# Patient Record
Sex: Female | Born: 1965 | Race: Black or African American | Hispanic: No | Marital: Single | Smoking: Never smoker
Health system: Southern US, Community
[De-identification: ages and names within clinical notes are randomized; demographics above are authoritative.]

## PROBLEM LIST (undated history)

## (undated) HISTORY — PX: DILATION AND CURETTAGE OF UTERUS: SHX78

---

## 2009-12-26 ENCOUNTER — Ambulatory Visit: Payer: Self-pay | Admitting: Orthopedic Surgery

## 2013-09-14 DIAGNOSIS — M545 Low back pain, unspecified: Secondary | ICD-10-CM | POA: Insufficient documentation

## 2015-05-21 ENCOUNTER — Ambulatory Visit (INDEPENDENT_AMBULATORY_CARE_PROVIDER_SITE_OTHER): Payer: Federal, State, Local not specified - PPO | Admitting: Podiatry

## 2015-05-21 ENCOUNTER — Ambulatory Visit (INDEPENDENT_AMBULATORY_CARE_PROVIDER_SITE_OTHER): Payer: Federal, State, Local not specified - PPO

## 2015-05-21 ENCOUNTER — Encounter: Payer: Self-pay | Admitting: Podiatry

## 2015-05-21 VITALS — BP 113/67 | HR 84 | Resp 16

## 2015-05-21 DIAGNOSIS — M5430 Sciatica, unspecified side: Secondary | ICD-10-CM | POA: Diagnosis not present

## 2015-05-21 DIAGNOSIS — M779 Enthesopathy, unspecified: Secondary | ICD-10-CM | POA: Diagnosis not present

## 2015-05-21 DIAGNOSIS — M2010 Hallux valgus (acquired), unspecified foot: Secondary | ICD-10-CM

## 2015-05-21 DIAGNOSIS — Q6650 Congenital pes planus, unspecified foot: Secondary | ICD-10-CM | POA: Diagnosis not present

## 2015-05-21 NOTE — Progress Notes (Signed)
   Subjective:    Patient ID: Barbara Dickerson, female    DOB: 19-Jul-1965, 50 y.o.   MRN: [id number]  HPI: She presents today with a 2 to three-month duration of numbness and tingling to the dorsal aspect of her right foot greater than her left with burning and tingling to the dorsal aspect of the hallux bilaterally. She does relate a history of back problems which seems to have gotten worse over the past few months she also relates a history of sciatica. She's tried nothing to alleviate her symptoms at this point. She states that she no she has bunion deformities and would like to consider having them corrected at some point.    Review of Systems  HENT: Positive for sneezing.   Eyes: Positive for itching.  Musculoskeletal: Positive for myalgias, back pain and arthralgias.  All other systems reviewed and are negative.      Objective:   Physical Exam: 50 year old female vital signs stable alert and oriented 3 pulses are palpable. Neurologic sensorium is intact per Semmes-Weinstein monofilament. Deep tendon reflexes are intact muscle strength is normal bilateral. Orthopedic evaluation demonstrates pes planus bilateral with hallux abductovalgus deformities bilateral right greater than left. No reproducible neurological findings. Radiographs confirm pes planus with hallux valgus deformities. Cutaneous evaluation of a straight supple well-hydrated cutis no erythema edema cellulitis drainage or odor.        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  Plan: Suggested that she follow-up with neurosurgery for possible injections or treatment of her spine and I also suggested orthotics to help align her lower extremity. She will follow up with [place] once she knows whether or not her insurance will take.

## 2020-07-03 ENCOUNTER — Other Ambulatory Visit: Payer: Self-pay | Admitting: Family Medicine

## 2020-07-03 DIAGNOSIS — Z1231 Encounter for screening mammogram for malignant neoplasm of breast: Secondary | ICD-10-CM

## 2020-07-15 ENCOUNTER — Ambulatory Visit
Admission: RE | Admit: 2020-07-15 | Discharge: 2020-07-15 | Disposition: A | Discharge status: Home / Self Care | Payer: Federal, State, Local not specified - PPO | Source: Ambulatory Visit | Attending: Family Medicine | Admitting: Family Medicine

## 2020-07-15 ENCOUNTER — Other Ambulatory Visit: Payer: Self-pay

## 2020-07-15 DIAGNOSIS — Z1231 Encounter for screening mammogram for malignant neoplasm of breast: Secondary | ICD-10-CM | POA: Insufficient documentation

## 2020-07-16 ENCOUNTER — Telehealth (INDEPENDENT_AMBULATORY_CARE_PROVIDER_SITE_OTHER): Payer: Self-pay | Admitting: Gastroenterology

## 2020-07-16 ENCOUNTER — Other Ambulatory Visit: Payer: Self-pay

## 2020-07-16 DIAGNOSIS — Z1211 Encounter for screening for malignant neoplasm of colon: Secondary | ICD-10-CM

## 2020-07-16 MED ORDER — GOLYTELY 236 G PO SOLR: 4000.0000 mL | Freq: Once | ORAL | 0 refills | Status: AC

## 2020-07-16 NOTE — Progress Notes (Signed)
Gastroenterology Pre-Procedure Review  Request Date: 08/520 Requesting Physician: Dr. Bonna Gains  PATIENT REVIEW QUESTIONS: The patient responded to the following health history questions as indicated:    1. Are you having any GI issues? yes (occasional abdominal discomfort, and sometimes rectal discomfort is experienced prior to having a bowel movement) 2. Do you have a personal history of Polyps? no 3. Do you have a family history of Colon Cancer or Polyps? no 4. Diabetes Mellitus? no 5. Joint replacements in the past 12 months?no 6. Major health problems in the past 3 months?no 7. Any artificial heart valves, MVP, or defibrillator?no    MEDICATIONS & ALLERGIES:    Patient reports the following regarding taking any anticoagulation/antiplatelet therapy:   Plavix, Coumadin, Eliquis, Xarelto, Lovenox, Pradaxa, Brilinta, or Effient? no Aspirin? no  Patient confirms/reports the following medications:  Current Outpatient Medications  Medication Sig Dispense Refill  . etodolac (LODINE) 500 MG tablet Take by mouth.    . methocarbamol (ROBAXIN) 750 MG tablet Take by mouth.    . predniSONE (DELTASONE) 10 MG tablet     . traMADol (ULTRAM) 50 MG tablet Take by mouth.     No current facility-administered medications for this visit.    Patient confirms/reports the following allergies:  No Known Allergies  No orders of the defined types were placed in this encounter.   AUTHORIZATION INFORMATION Primary Insurance: 1D#: Group #:  Secondary Insurance: 1D#: Group #:  SCHEDULE INFORMATION: Date: Thursday 08/07/20 Time: Location:[facility]

## 2020-08-06 ENCOUNTER — Telehealth: Payer: Self-pay | Admitting: Gastroenterology

## 2020-08-06 NOTE — Telephone Encounter (Signed)
Patient has questions regarding her prep.  Her procedure is tomorrow,  please call asap

## 2020-08-07 ENCOUNTER — Encounter: Payer: Self-pay | Admitting: Gastroenterology

## 2020-08-07 ENCOUNTER — Encounter
Admission: RE | Disposition: A | Discharge status: Home / Self Care | Payer: Self-pay | Source: Home / Self Care | Attending: Gastroenterology

## 2020-08-07 ENCOUNTER — Ambulatory Visit: Payer: Federal, State, Local not specified - PPO | Admitting: Anesthesiology

## 2020-08-07 ENCOUNTER — Ambulatory Visit
Admission: RE | Admit: 2020-08-07 | Discharge: 2020-08-07 | Disposition: A | Discharge status: Home / Self Care | Payer: Federal, State, Local not specified - PPO | Attending: Gastroenterology | Admitting: Gastroenterology

## 2020-08-07 DIAGNOSIS — Z7952 Long term (current) use of systemic steroids: Secondary | ICD-10-CM | POA: Insufficient documentation

## 2020-08-07 DIAGNOSIS — D122 Benign neoplasm of ascending colon: Secondary | ICD-10-CM | POA: Insufficient documentation

## 2020-08-07 DIAGNOSIS — D120 Benign neoplasm of cecum: Secondary | ICD-10-CM | POA: Insufficient documentation

## 2020-08-07 DIAGNOSIS — K635 Polyp of colon: Secondary | ICD-10-CM

## 2020-08-07 DIAGNOSIS — Z1211 Encounter for screening for malignant neoplasm of colon: Secondary | ICD-10-CM | POA: Diagnosis present

## 2020-08-07 DIAGNOSIS — K5730 Diverticulosis of large intestine without perforation or abscess without bleeding: Secondary | ICD-10-CM | POA: Insufficient documentation

## 2020-08-07 HISTORY — PX: COLONOSCOPY WITH PROPOFOL: SHX5780

## 2020-08-07 LAB — POCT PREGNANCY, URINE: Preg Test, Ur: NEGATIVE

## 2020-08-07 SURGERY — COLONOSCOPY WITH PROPOFOL
Anesthesia: General

## 2020-08-07 MED ORDER — LIDOCAINE HCL (CARDIAC) PF 100 MG/5ML IV SOSY
PREFILLED_SYRINGE | INTRAVENOUS | Status: DC | PRN
  Administered 2020-08-07: 50 mg via INTRAVENOUS

## 2020-08-07 MED ORDER — PROPOFOL 500 MG/50ML IV EMUL
INTRAVENOUS | Status: DC | PRN
  Administered 2020-08-07: 175 ug/kg/min via INTRAVENOUS

## 2020-08-07 MED ORDER — PROPOFOL 10 MG/ML IV BOLUS
INTRAVENOUS | Status: DC | PRN
  Administered 2020-08-07: 70 mg via INTRAVENOUS

## 2020-08-07 MED ORDER — LIDOCAINE HCL (PF) 2 % IJ SOLN
INTRAMUSCULAR | Status: AC
  Filled 2020-08-07: qty 5

## 2020-08-07 MED ORDER — PROPOFOL 500 MG/50ML IV EMUL
INTRAVENOUS | Status: AC
  Filled 2020-08-07: qty 50

## 2020-08-07 MED ORDER — SODIUM CHLORIDE 0.9 % IV SOLN: INTRAVENOUS | Status: DC

## 2020-08-07 NOTE — Op Note (Signed)
[facility] Gastroenterology Patient Name: Barbara Dickerson Procedure Date: 08/07/2020 7:47 AM MRN: [id number] Account #: [id number] Date of Birth: 1965/07/19 Admit Type: Outpatient Age: 55 Room: [facility] ENDO ROOM 2 Gender: Female Note Status: Finalized Procedure:             Colonoscopy Indications:           Screening for colorectal malignant neoplasm Providers:             Lannette Avellino B. Maximino Greenland MD, MD Referring MD:          Hassell Halim MD (Referring MD) Medicines:             Monitored Anesthesia Care Complications:         No immediate complications. Procedure:             Pre-Anesthesia Assessment:                        - ASA Grade Assessment: II - A patient with mild                         systemic disease.                        - Prior to the procedure, a History and Physical was                         performed, and patient medications, allergies and                         sensitivities were reviewed. The patient's tolerance                         of previous anesthesia was reviewed.                        - The risks and benefits of the procedure and the                         sedation options and risks were discussed with the                         patient. All questions were answered and informed                         consent was obtained.                        - Patient identification and proposed procedure were                         verified prior to the procedure by the physician, the                         nurse, the anesthesiologist, the anesthetist and the                         technician. The procedure was verified in the                         procedure  room.                        After obtaining informed consent, the colonoscope was                         passed under direct vision. Throughout the procedure,                         the patient's blood pressure, pulse, and oxygen                         saturations were  monitored continuously. The                         Colonoscope was introduced through the anus and                         advanced to the the cecum, identified by appendiceal                         orifice and ileocecal valve. The colonoscopy was                         performed with ease. The patient tolerated the                         procedure well. The quality of the bowel preparation                         was good. Findings:      The perianal and digital rectal examinations were normal.      Two flat polyps were found in the ascending colon and ileocecal valve.       The polyps were 3 to 4 mm in size. These polyps were removed with a       jumbo cold forceps. Resection and retrieval were complete.      Multiple diverticula were found in the sigmoid colon.      The exam was otherwise without abnormality.      The rectum, sigmoid colon, descending colon, transverse colon, ascending       colon and cecum appeared normal.      The retroflexed view of the distal rectum and anal verge was normal and       showed no anal or rectal abnormalities. Impression:            - Two 3 to 4 mm polyps in the ascending colon and at                         the ileocecal valve, removed with a jumbo cold                         forceps. Resected and retrieved.                        - Diverticulosis in the sigmoid colon.                        - The examination was otherwise normal.                        -  The rectum, sigmoid colon, descending colon,                         transverse colon, ascending colon and cecum are normal.                        - The distal rectum and anal verge are normal on                         retroflexion view. Recommendation:        - Discharge patient to home (with escort).                        - High fiber diet.                        - Advance diet as tolerated.                        - Continue present medications.                        - Await pathology  results.                        - Repeat colonoscopy date to be determined after                         pending pathology results are reviewed.                        - The findings and recommendations were discussed with                         the patient.                        - The findings and recommendations were discussed with                         the patient's family.                        - Return to primary care physician as previously                         scheduled. Procedure Code(s):     --- Professional ---                        [phone], Colonoscopy, flexible; with biopsy, single or                         multiple Diagnosis Code(s):     --- Professional ---                        Z12.11, Encounter for screening for malignant neoplasm                         of colon  K63.5, Polyp of colon CPT copyright 2019 American Medical Association. All rights reserved. The codes documented in this report are preliminary and upon coder review may  be revised to meet current compliance requirements.  Vonda Antigua, MD Margretta Sidle B. Bonna Gains MD, MD 08/07/2020 9:11:58 AM This report has been signed electronically. Number of Addenda: 0 Note Initiated On: 08/07/2020 7:47 AM Scope Withdrawal Time: 0 hours 21 minutes 5 seconds  Total Procedure Duration: 0 hours 32 minutes 20 seconds  Estimated Blood Loss:  Estimated blood loss: none.      [facility]

## 2020-08-07 NOTE — Transfer of Care (Signed)
Immediate Anesthesia Transfer of Care Note  Patient: Barbara Dickerson  Procedure(s) Performed: COLONOSCOPY WITH PROPOFOL (N/A )  Patient Location: PACU  Anesthesia Type:General  Level of Consciousness: sedated  Airway & Oxygen Therapy: Patient Spontanous Breathing  Post-op Assessment: Report given to RN and Post -op Vital signs reviewed and stable  Post vital signs: Reviewed and stable  Last Vitals:  Vitals Value Taken Time  BP 98/57 08/07/20 0914  Temp    Pulse 50 08/07/20 0914  Resp 21 08/07/20 0914  SpO2 95 % 08/07/20 0914    Last Pain:  Vitals:   08/07/20 0800  TempSrc: Temporal  PainSc: 0-No pain         Complications: No complications documented.

## 2020-08-07 NOTE — Anesthesia Procedure Notes (Signed)
Date/Time: 08/07/2020 8:33 AM Performed by: Johnna Acosta, CRNA Pre-anesthesia Checklist: Patient identified, Emergency Drugs available, Suction available, Patient being monitored and Timeout performed Patient Re-evaluated:Patient Re-evaluated prior to induction Oxygen Delivery Method: Nasal cannula Preoxygenation: Pre-oxygenation with 100% oxygen Induction Type: IV induction

## 2020-08-07 NOTE — H&P (Signed)
  Vonda Antigua, MD [address], [place], [place], [place], [address] [address], [place], [place], [place], [address] Phone: [phone]  Fax: [phone]  Primary Care Physician:  Derinda Late, MD   Pre-Procedure History & Physical: HPI:  Barbara Dickerson is a 55 y.o. female is here for a colonoscopy.   History reviewed. No pertinent past medical history.  Past Surgical History:  Procedure Laterality Date  . DILATION AND CURETTAGE OF UTERUS      Prior to Admission medications   Medication Sig Start Date End Date Taking? Authorizing Provider  etodolac (LODINE) 500 MG tablet Take by mouth. Patient not taking: No sig reported    [provider]  methocarbamol (ROBAXIN) 750 MG tablet Take by mouth. Patient not taking: No sig reported    [provider]  predniSONE (DELTASONE) 10 MG tablet  03/24/15   [provider]  traMADol (ULTRAM) 50 MG tablet Take by mouth. Patient not taking: No sig reported    [provider]    Allergies as of 07/16/2020  . (No Known Allergies)    Family History  Problem Relation Age of Onset  . Breast cancer Sister        20's  . Breast cancer Maternal Aunt 27    Social History   Socioeconomic History  . Marital status: Single    Spouse name: Not on file  . Number of children: Not on file  . Years of education: Not on file  . Highest education level: Not on file  Occupational History  . Not on file  Tobacco Use  . Smoking status: Never Smoker  . Smokeless tobacco: Never Used  Substance and Sexual Activity  . Alcohol use: Yes    Alcohol/week: 0.0 standard drinks    Comment: very rarely  . Drug use: Never  . Sexual activity: Not on file  Other Topics Concern  . Not on file  Social History Narrative  . Not on file   Social Determinants of Health   Financial Resource Strain: Not on file  Food Insecurity: Not on file  Transportation Needs: Not on file  Physical Activity: Not on  file  Stress: Not on file  Social Connections: Not on file  Intimate Partner Violence: Not on file    Review of Systems: See HPI, otherwise negative ROS  Physical Exam: BP (!) 143/88   Pulse 76   Temp (!) 96.2 F (35.7 C) (Temporal)   Resp 18   Ht 5\' 6"  (1.676 m)   Wt 86.2 kg   SpO2 100%   BMI 30.67 kg/m  General:   Alert,  pleasant and cooperative in NAD Head:  Normocephalic and atraumatic. Neck:  Supple; no masses or thyromegaly. Lungs:  Clear throughout to auscultation, normal respiratory effort.    Heart:  +S1, +S2, Regular rate and rhythm, No edema. Abdomen:  Soft, nontender and nondistended. Normal bowel sounds, without guarding, and without rebound.   Neurologic:  Alert and  oriented x4;  grossly normal neurologically.  Impression/Plan: Barbara Dickerson is here for a colonoscopy to be performed for average risk screening.  Risks, benefits, limitations, and alternatives regarding  colonoscopy have been reviewed with the patient.  Questions have been answered.  All parties agreeable.   Virgel Manifold, MD  08/07/2020, 8:28 AM

## 2020-08-07 NOTE — Anesthesia Preprocedure Evaluation (Signed)
Anesthesia Evaluation  Patient identified by MRN, date of birth, ID band Patient awake    Reviewed: Allergy & Precautions, NPO status , Patient's Chart, lab work & pertinent test results  Airway Mallampati: III       Dental   Pulmonary neg pulmonary ROS,    Pulmonary exam normal        Cardiovascular negative cardio ROS Normal cardiovascular exam     Neuro/Psych negative neurological ROS  negative psych ROS   GI/Hepatic negative GI ROS, Neg liver ROS,   Endo/Other  negative endocrine ROS  Renal/GU negative Renal ROS  negative genitourinary   Musculoskeletal  (+) Arthritis , Osteoarthritis,    Abdominal Normal abdominal exam  (+)   Peds negative pediatric ROS (+)  Hematology negative hematology ROS (+)   Anesthesia Other Findings History reviewed. No pertinent past medical history.  Reproductive/Obstetrics                             Anesthesia Physical Anesthesia Plan  ASA: II  Anesthesia Plan: General   Post-op Pain Management:    Induction: Intravenous  PONV Risk Score and Plan: Propofol infusion  Airway Management Planned: Nasal Cannula  Additional Equipment:   Intra-op Plan:   Post-operative Plan:   Informed Consent: I have reviewed the patients History and Physical, chart, labs and discussed the procedure including the risks, benefits and alternatives for the proposed anesthesia with the patient or authorized representative who has indicated his/her understanding and acceptance.     Dental advisory given  Plan Discussed with: CRNA and Surgeon  Anesthesia Plan Comments:         Anesthesia Quick Evaluation

## 2020-08-07 NOTE — Anesthesia Postprocedure Evaluation (Signed)
Anesthesia Post Note  Patient: Barbara Dickerson  Procedure(s) Performed: COLONOSCOPY WITH PROPOFOL (N/A )  Patient location during evaluation: Endoscopy Anesthesia Type: General Level of consciousness: awake and alert and oriented Pain management: pain level controlled Vital Signs Assessment: post-procedure vital signs reviewed and stable Respiratory status: spontaneous breathing Cardiovascular status: blood pressure returned to baseline Anesthetic complications: no   No complications documented.   Last Vitals:  Vitals:   08/07/20 0924 08/07/20 0934  BP: 102/62 113/66  Pulse: 75 67  Resp: 19 16  Temp:    SpO2: 96% 100%    Last Pain:  Vitals:   08/07/20 0934  TempSrc:   PainSc: 0-No pain                 Patrick Salemi

## 2020-08-08 ENCOUNTER — Encounter: Payer: Self-pay | Admitting: Gastroenterology

## 2020-08-08 LAB — SURGICAL PATHOLOGY

## 2021-05-15 ENCOUNTER — Encounter: Payer: Self-pay | Admitting: Cardiovascular Disease

## 2021-05-15 ENCOUNTER — Ambulatory Visit: Payer: Federal, State, Local not specified - PPO | Admitting: Cardiovascular Disease

## 2021-05-15 ENCOUNTER — Other Ambulatory Visit: Payer: Self-pay

## 2021-05-15 VITALS — BP 120/72 | HR 71 | Ht 66.0 in | Wt 194.0 lb

## 2021-05-15 DIAGNOSIS — R002 Palpitations: Secondary | ICD-10-CM | POA: Diagnosis not present

## 2021-05-15 DIAGNOSIS — R079 Chest pain, unspecified: Secondary | ICD-10-CM | POA: Diagnosis not present

## 2021-05-15 DIAGNOSIS — Z8249 Family history of ischemic heart disease and other diseases of the circulatory system: Secondary | ICD-10-CM

## 2021-05-15 NOTE — Patient Instructions (Addendum)
Cardiomobile for palpitations  Medication Instructions:  No changes  If you need a refill on your cardiac medications before your next appointment, please call your pharmacy.   Lab work: No new labs needed  Testing/Procedures: 1) CT Coronary Calcium score: - information give today - please call the office at [phone] if you would like [place] to place an order for you to have this done.   $99 out of pocket cost at the time of your test  Outpatient [place], [place] [address] [phone]    Follow-Up: At [facility], you and your health needs are our priority.  As part of our continuing mission to provide you with exceptional heart care, we have created designated Provider Care Teams.  These Care Teams include your primary Cardiologist (physician) and Advanced Practice Providers (APPs -  Physician Assistants and Nurse Practitioners) who all work together to provide you with the care you need, when you need it.  You will need a follow up appointment as needed  Providers on your designated Care Team:   Murray Hodgkins, NP Christell Faith, PA-C Cadence Kathlen Mody, [place]  COVID-19 Vaccine Information can be found at: ShippingScam.co.uk For questions related to vaccine distribution or appointments, please email [email or web address] .[email or web address] or call [phone].    Kardia for monitoring of EKGs at home: You can look into the [facility] device by [employer].This device is purchased by you and it connects to an application you download to your smart phone.  It can detect abnormal heart rhythms and alert you to contact your doctor for further evaluation. The device is approximately $90 and the phone application is free.  The web site is:  [email or web address]://www.alivecor.com

## 2021-05-15 NOTE — Progress Notes (Signed)
Cardiology Office Note  Date:  05/17/2021   ID:  Barbara Dickerson, DOB 01/28/1966, MRN [id number]  PCP:  Derinda Late, MD   Cc: chest pain  HPI:  [name] is a 56 year old woman with past medical history of Family history coronary disease No diabetes, No smoking Presents by self-referral from Dr. Otto Herb for consultation of her intermittent chest pain, family history  Chest pain in chest, seems to happen more at rest and with exertion Dull ache Extension to neck at times Active, no regular exercise program  Periodic palpitations  No prior cardiac studies  Lab work reviewed Total cholesterol 132 LDL 65 Excellent cholesterol, not on medication  Siblings: ablation Father: on blood thinner?, possible   EKG personally reviewed by myself on todays visit NSR rate 71 bpm no significant ST or T wave changes  PMH:   has no past medical history on file.  PSH:    Past Surgical History:  Procedure Laterality Date   COLONOSCOPY WITH PROPOFOL N/A 08/07/2020   Procedure: COLONOSCOPY WITH PROPOFOL;  Surgeon: Virgel Manifold, MD;  Location: [facility] ENDOSCOPY;  Service: Endoscopy;  Laterality: N/A;   DILATION AND CURETTAGE OF UTERUS      Current Outpatient Medications  Medication Sig Dispense Refill   SF 5000 PLUS 1.1 % CREA dental cream daily at 6 (six) AM.     No current facility-administered medications for this visit.    Allergies:   Patient has no known allergies.   Social History:  The patient  reports that she has never smoked. She has never used smokeless tobacco. She reports current alcohol use. She reports that she does not use drugs.   Family History:   family history includes Breast cancer in her sister; Breast cancer (age of onset: 65) in her maternal aunt.    Review of Systems: Review of Systems  Constitutional: Negative.   HENT: Negative.    Respiratory: Negative.    Cardiovascular:  Positive for chest pain.  Gastrointestinal: Negative.    Musculoskeletal: Negative.   Neurological: Negative.   Psychiatric/Behavioral: Negative.    All other systems reviewed and are negative.   PHYSICAL EXAM: VS:  BP 120/72    Pulse 71    Ht 5\' 6"  (1.676 m)    Wt 194 lb (88 kg)    SpO2 98%    BMI 31.31 kg/m  , BMI Body mass index is 31.31 kg/m. GEN: Well nourished, well developed, in no acute distress HEENT: normal Neck: no JVD, carotid bruits, or masses Cardiac: RRR; no murmurs, rubs, or gallops,no edema  Respiratory:  clear to auscultation bilaterally, normal work of breathing GI: soft, nontender, nondistended, + BS MS: no deformity or atrophy Skin: warm and dry, no rash Neuro:  Strength and sensation are intact Psych: euthymic mood, full affect  Recent Labs: No results found for requested labs within last 8760 hours.    Lipid Panel No results found for: CHOL, HDL, LDLCALC, TRIG    Wt Readings from Last 3 Encounters:  05/15/21 194 lb (88 kg)  08/07/20 190 lb (86.2 kg)     ASSESSMENT AND PLAN:  Problem List Items Addressed This Visit   None Visit Diagnoses     Chest pain of uncertain etiology    -  Primary   Relevant Orders   EKG 12-Lead   Family history of coronary arteriosclerosis       Palpitations          Chest pain Atypical in nature, No associated with  exertion Very low cholesterol on no medication, no diabetes, non-smoker At her request, CT coronary calcium scoring ordered for risk stratification  Palpitations Suspect PACs, PVCs Recommended cardia mobile for further monitoring at home Less likely atrial fibrillation/flutter    Total encounter time more than 60 minutes  Greater than 50% was spent in counseling and coordination of care with the patient    Signed, Esmond Plants, M.D., Ph.D. [place], [place]

## 2021-07-16 ENCOUNTER — Other Ambulatory Visit: Payer: Self-pay | Admitting: Family Medicine

## 2021-07-16 DIAGNOSIS — Z1231 Encounter for screening mammogram for malignant neoplasm of breast: Secondary | ICD-10-CM

## 2021-09-01 ENCOUNTER — Ambulatory Visit
Admission: RE | Admit: 2021-09-01 | Discharge: 2021-09-01 | Disposition: A | Discharge status: Home / Self Care | Payer: Federal, State, Local not specified - PPO | Source: Ambulatory Visit | Attending: Family Medicine | Admitting: Family Medicine

## 2021-09-01 ENCOUNTER — Encounter: Payer: Self-pay | Admitting: Radiology

## 2021-09-01 DIAGNOSIS — Z1231 Encounter for screening mammogram for malignant neoplasm of breast: Secondary | ICD-10-CM | POA: Insufficient documentation

## 2022-09-03 ENCOUNTER — Other Ambulatory Visit: Payer: Self-pay | Admitting: Family Medicine

## 2022-09-03 DIAGNOSIS — Z1231 Encounter for screening mammogram for malignant neoplasm of breast: Secondary | ICD-10-CM

## 2022-09-21 ENCOUNTER — Ambulatory Visit
Admission: RE | Admit: 2022-09-21 | Discharge: 2022-09-21 | Disposition: A | Discharge status: Home / Self Care | Payer: Federal, State, Local not specified - PPO | Source: Ambulatory Visit | Attending: Family Medicine | Admitting: Family Medicine

## 2022-09-21 DIAGNOSIS — Z1231 Encounter for screening mammogram for malignant neoplasm of breast: Secondary | ICD-10-CM | POA: Diagnosis present

## 2022-12-17 IMAGING — MG MM DIGITAL SCREENING BILAT W/ TOMO AND CAD
8 series · 8 of 24 positions shown · non-contrast
Comparison: Previous exam(s).

CLINICAL DATA: Screening.

EXAM:
DIGITAL SCREENING BILATERAL MAMMOGRAM WITH TOMOSYNTHESIS AND CAD
TECHNIQUE: Bilateral screening digital craniocaudal and mediolateral oblique
mammograms were obtained. Bilateral screening digital breast
tomosynthesis was performed. The images were evaluated with
computer-aided detection.

[R MLO synth-2D]
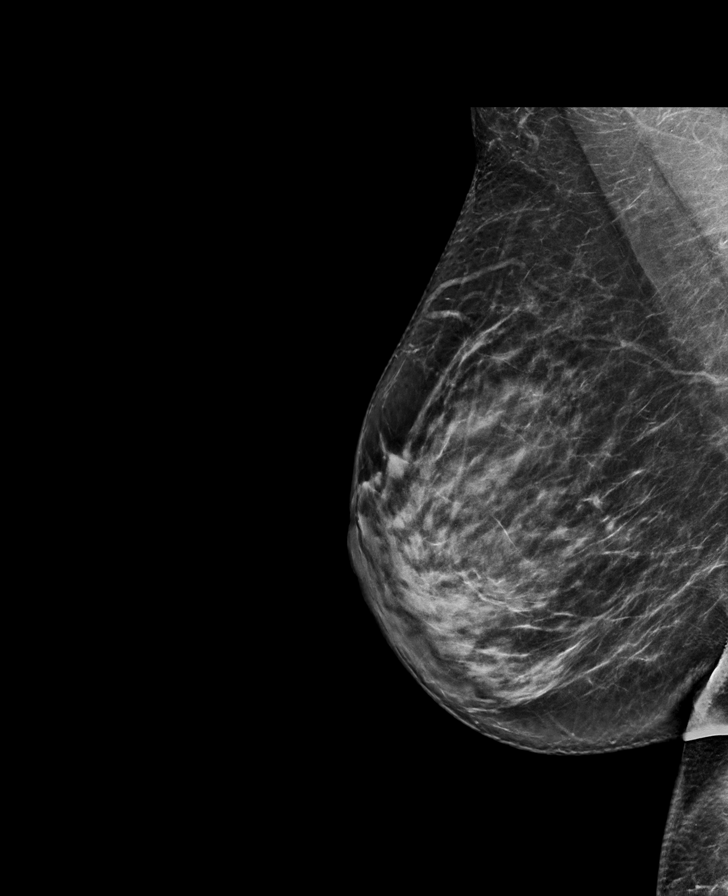

[L MLO synth-2D]
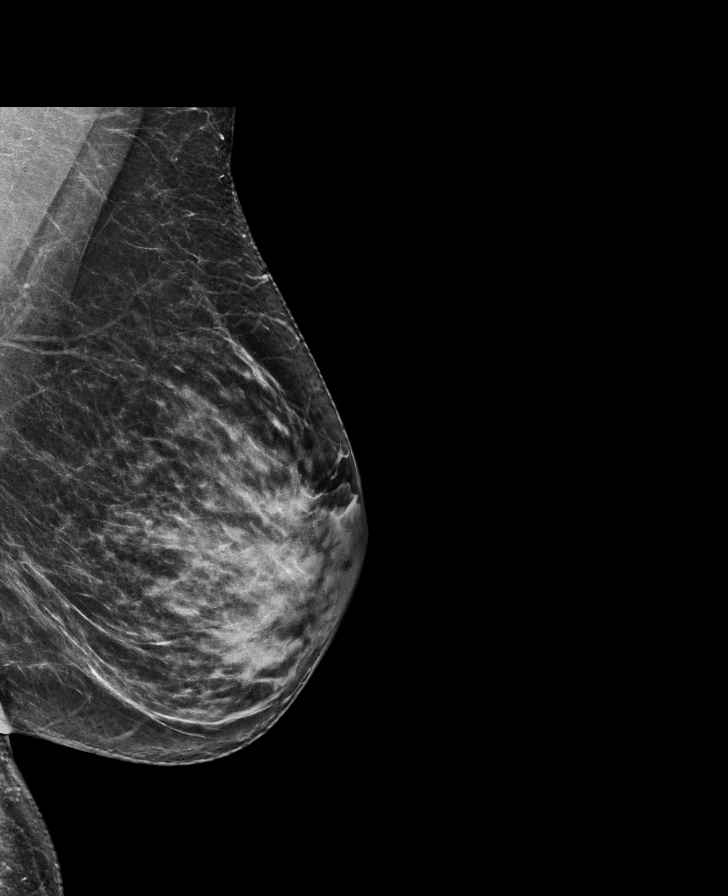

[R CC synth-2D]
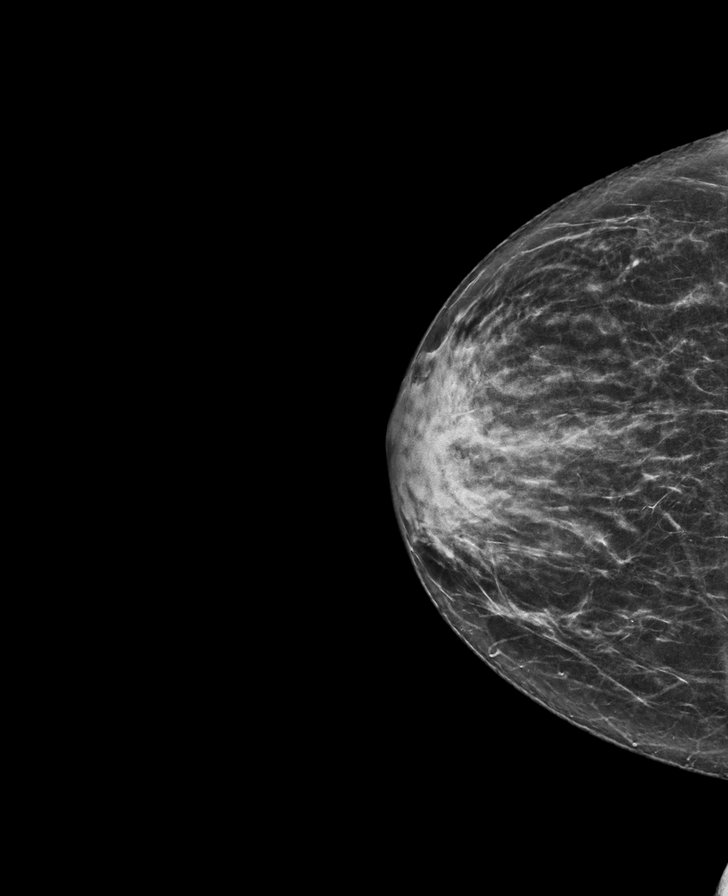

[L CC synth-2D]
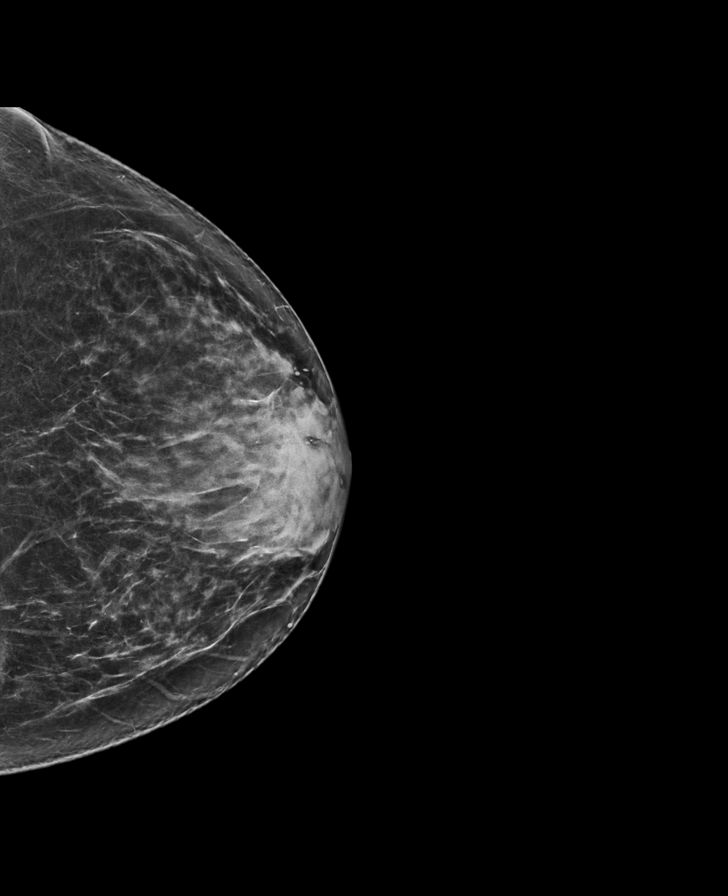

[R MLO tomo · tomo slice 39/78.0]
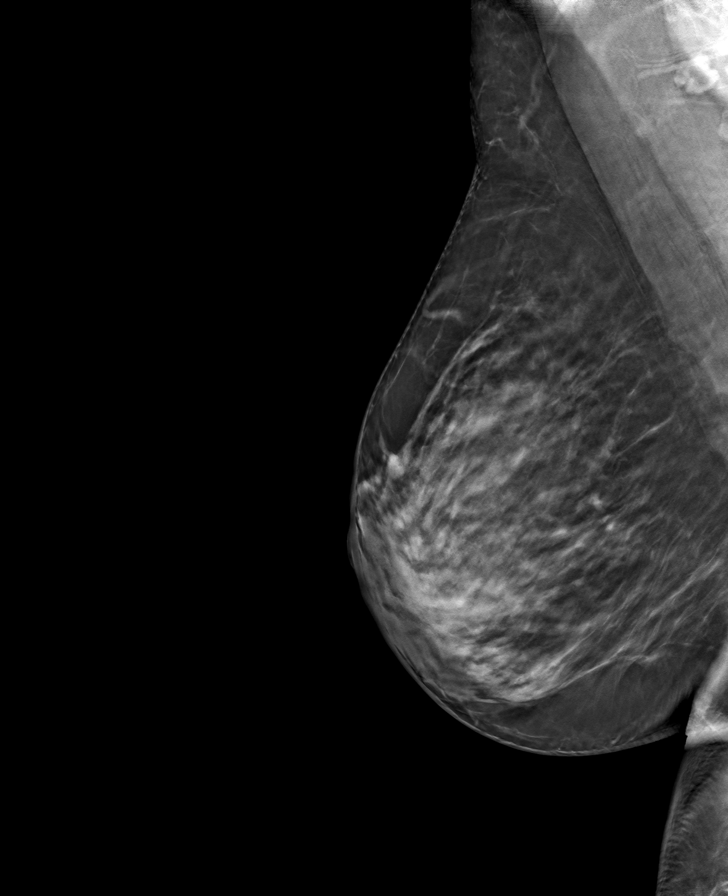

[R CC tomo · tomo slice 37/72.0]
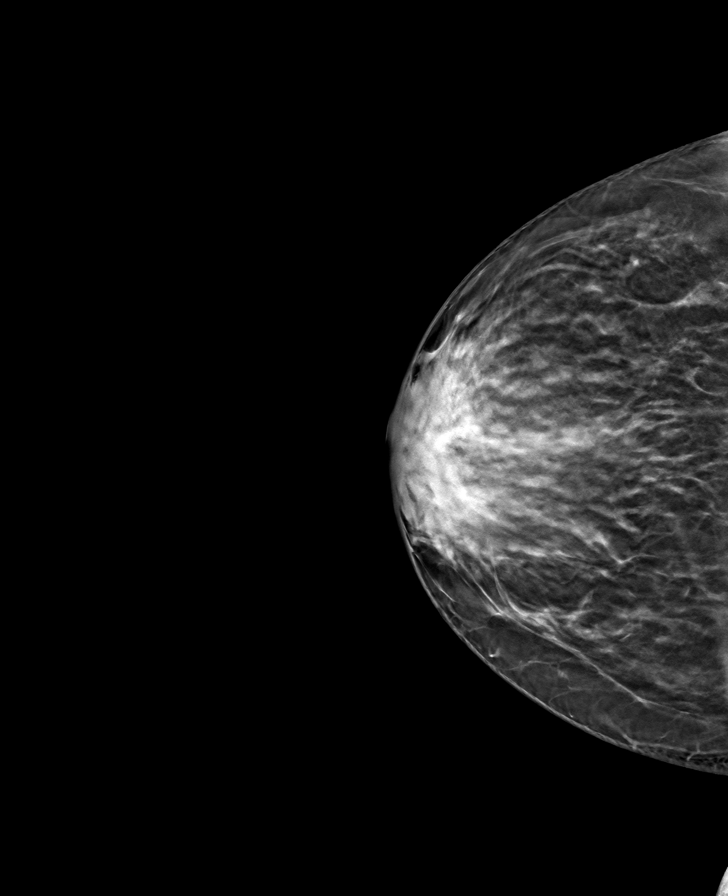

[L MLO tomo · tomo slice 38/75.0]
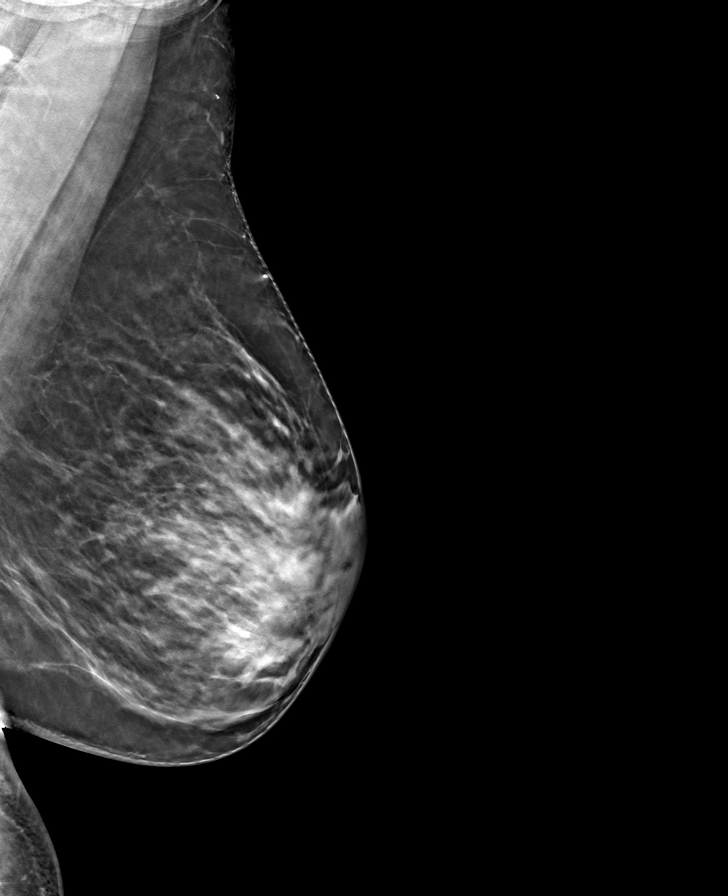

[L CC tomo · tomo slice 39/76.0]
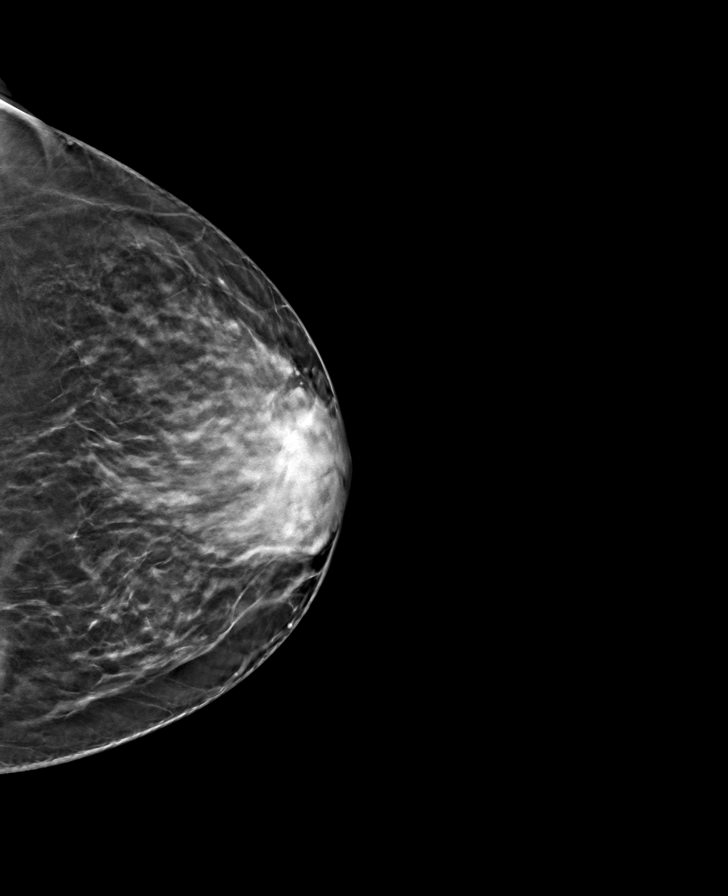

[8 of 24 positions shown; findings below may reference images not displayed]

ACR Breast Density Category c: The breast tissue is heterogeneously
dense, which may obscure small masses.
FINDINGS: There are no findings suspicious for malignancy.
IMPRESSION: No mammographic evidence of malignancy. A result letter of this
screening mammogram will be mailed directly to the patient.

RECOMMENDATION:
Screening mammogram in one year. (Code:Q3-W-BC3)

BI-RADS CATEGORY  1: Negative.

## 2023-09-15 ENCOUNTER — Ambulatory Visit: Admitting: Podiatry

## 2023-09-15 DIAGNOSIS — M7661 Achilles tendinitis, right leg: Secondary | ICD-10-CM

## 2023-09-15 DIAGNOSIS — Q666 Other congenital valgus deformities of feet: Secondary | ICD-10-CM | POA: Diagnosis not present

## 2023-09-15 MED ORDER — METHYLPREDNISOLONE 4 MG PO TBPK: ORAL_TABLET | ORAL | 0 refills | Status: AC

## 2023-09-15 MED ORDER — MELOXICAM 15 MG PO TABS: 15.0000 mg | ORAL_TABLET | Freq: Every day | ORAL | 0 refills | Status: AC

## 2023-09-15 NOTE — Progress Notes (Signed)
  Subjective:  Patient ID: Barbara Dickerson, female    DOB: Aug 23, 1965,  MRN: [id number]  Chief Complaint  Patient presents with   Foot Pain    Bilateral foot pain right is worse than left she stated that she gets a throbbing sensation on the sides and top and sometimes its worse in the morning.     58 y.o. female presents with the above complaint.  Patient presents with generalized foot pain to the right side.  She states the right bottom and back of the heel hurts worse with ambulation or shoe pressure is throbbing sensation on the side.  It is worse in the morning.  The left side is also starting experiences safety due to compensation.  She does a lot on her feet has not seen anyone else prior to seeing me for this.  Denies any other acute complaints.   Review of Systems: Negative except as noted in the HPI. Denies N/V/F/Ch.  No past medical history on file.  Current Outpatient Medications:    etodolac (LODINE) 500 MG tablet, Take 500 mg by mouth., Disp: , Rfl:    meloxicam  (MOBIC ) 15 MG tablet, Take 1 tablet (15 mg total) by mouth daily., Disp: 30 tablet, Rfl: 0   methylPREDNISolone  (MEDROL  DOSEPAK) 4 MG TBPK tablet, Take as directed, Disp: 21 each, Rfl: 0   SF 5000 PLUS 1.1 % CREA dental cream, daily at 6 (six) AM., Disp: , Rfl:   Social History   Tobacco Use  Smoking Status Never  Smokeless Tobacco Never    No Known Allergies Objective:  There were no vitals filed for this visit. There is no height or weight on file to calculate BMI. Constitutional Well developed. Well nourished.  Vascular Dorsalis pedis pulses palpable bilaterally. Posterior tibial pulses palpable bilaterally. Capillary refill normal to all digits.  No cyanosis or clubbing noted. Pedal hair growth normal.  Neurologic Normal speech. Oriented to person, place, and time. Epicritic sensation to light touch grossly present bilaterally.  Dermatologic Nails well groomed and normal in appearance. No open  wounds. No skin lesions.  Orthopedic: Right pain on palpation along the course of the plantar fascia including the calcaneal tuber pain with dorsiflexion of the ankle joint pain at the Achilles tendon insertion positive gastrocnemius equinus noted.  Positive Haglund's deformity noted.   Radiographs: None Assessment:  No diagnosis found. Plan:  Patient was evaluated and treated and all questions answered.  Right generalized foot pain with plantar fasciitis/Achilles tendinitis - All questions and concerns were discussed with the patient extensive detail given the amount of pain that she is experiencing she would benefit from cam boot immobilization help decrease inflammatory component surgical pain.  Patient agrees with plan to proceed with cam boot immobilization if there is still some residual pain we discussed steroid injection during next visit  Pes planovalgus -I explained to patient the etiology of pes planovalgus and relationship with Planter fasciitis and various treatment options were discussed.  Given patient foot structure in the setting of Planter fasciitis I believe patient will benefit from custom-made orthotics to help control the hindfoot motion support the arch of the foot and take the stress away from plantar fascial.  Patient agrees with the plan like to proceed with orthotics -Patient was casted for orthotics   No follow-ups on file.

## 2023-10-13 NOTE — Progress Notes (Signed)
  Orthotic order placed will schedule for fitting when in

## 2023-10-27 ENCOUNTER — Ambulatory Visit: Admitting: Podiatry

## 2023-10-27 ENCOUNTER — Telehealth: Payer: Self-pay

## 2023-10-27 NOTE — Telephone Encounter (Signed)
 LVM to schedule orthotic fitting/ pu

## 2023-11-14 ENCOUNTER — Telehealth: Payer: Self-pay

## 2023-11-14 NOTE — Telephone Encounter (Signed)
 Orthotics in [place] bin

## 2024-02-20 ENCOUNTER — Other Ambulatory Visit: Payer: Self-pay | Admitting: Family Medicine

## 2024-02-20 DIAGNOSIS — Z1231 Encounter for screening mammogram for malignant neoplasm of breast: Secondary | ICD-10-CM

## 2024-02-21 ENCOUNTER — Telehealth: Payer: Self-pay | Admitting: Podiatry

## 2024-02-21 NOTE — Telephone Encounter (Signed)
 Patient asks do you still want to see her or not?

## 2024-02-28 ENCOUNTER — Ambulatory Visit: Admitting: Podiatry

## 2024-02-28 DIAGNOSIS — M7661 Achilles tendinitis, right leg: Secondary | ICD-10-CM

## 2024-02-28 DIAGNOSIS — Q666 Other congenital valgus deformities of feet: Secondary | ICD-10-CM

## 2024-02-28 NOTE — Progress Notes (Signed)
  Subjective:  Patient ID: Barbara Dickerson, female    DOB: [birth year],  MRN: [id number]  Chief Complaint  Patient presents with   Foot Pain    Pt stated that she has been having some pain with her foot     58 y.o. female presents with the above complaint.  Patient presents for follow-up of bilateral plantar fascia as well as Achilles tendinitis she states she is doing better she still has some pain.  She was supposed to follow-up but she did not.  She is here to pick up her orthotics   Review of Systems: Negative except as noted in the HPI. Denies N/V/F/Ch.  No past medical history on file.  Current Outpatient Medications:    etodolac (LODINE) 500 MG tablet, Take 500 mg by mouth., Disp: , Rfl:    meloxicam  (MOBIC ) 15 MG tablet, Take 1 tablet (15 mg total) by mouth daily., Disp: 30 tablet, Rfl: 0   methylPREDNISolone  (MEDROL  DOSEPAK) 4 MG TBPK tablet, Take as directed, Disp: 21 each, Rfl: 0   SF 5000 PLUS 1.1 % CREA dental cream, daily at 6 (six) AM., Disp: , Rfl:   Social History   Tobacco Use  Smoking Status Never  Smokeless Tobacco Never    No Known Allergies Objective:  There were no vitals filed for this visit. There is no height or weight on file to calculate BMI. Constitutional Well developed. Well nourished.  Vascular Dorsalis pedis pulses palpable bilaterally. Posterior tibial pulses palpable bilaterally. Capillary refill normal to all digits.  No cyanosis or clubbing noted. Pedal hair growth normal.  Neurologic Normal speech. Oriented to person, place, and time. Epicritic sensation to light touch grossly present bilaterally.  Dermatologic Nails well groomed and normal in appearance. No open wounds. No skin lesions.  Orthopedic: Right pain on palpation along the course of the plantar fascia including the calcaneal tuber pain with dorsiflexion of the ankle joint pain at the Achilles tendon insertion positive gastrocnemius equinus noted.  Positive Haglund's  deformity noted.   Radiographs: None Assessment:   1. Pes planovalgus   2. Achilles tendinitis, right leg    Plan:  Patient was evaluated and treated and all questions answered.  Right generalized foot pain with plantar fasciitis/Achilles tendinitis - All questions and concerns were discussed with the patient extensive detail clinically doing much better.  But she still having some residual pain.  She would like to focus more on shoe gear modification which she is wearing better shoe as well as orthotics.  If it continues to bother her we will discuss injections in the future  Pes planovalgus -I explained to patient the etiology of pes planovalgus and relationship with Planter fasciitis and various treatment options were discussed.  Given patient foot structure in the setting of Planter fasciitis I believe patient will benefit from custom-made orthotics to help control the hindfoot motion support the arch of the foot and take the stress away from plantar fascial.  Patient agrees with the plan like to proceed with orthotics - Orthotics are dispensed they are functioning well no acute complaints   No follow-ups on file.

## 2024-04-17 ENCOUNTER — Ambulatory Visit
Admission: RE | Admit: 2024-04-17 | Discharge: 2024-04-17 | Disposition: A | Discharge status: Home / Self Care | Source: Ambulatory Visit | Attending: Family Medicine | Admitting: Family Medicine

## 2024-04-17 DIAGNOSIS — Z1231 Encounter for screening mammogram for malignant neoplasm of breast: Secondary | ICD-10-CM | POA: Insufficient documentation

## 2024-04-23 ENCOUNTER — Encounter: Payer: Self-pay | Admitting: Podiatry
